# Patient Record
Sex: Female | Born: 1980 | Race: Black or African American | Hispanic: No | Marital: Single | Smoking: Never smoker
Health system: Southern US, Community
[De-identification: ages and names within clinical notes are randomized; demographics above are authoritative.]

---

## 2019-01-22 ENCOUNTER — Ambulatory Visit (HOSPITAL_COMMUNITY)
Admission: EM | Admit: 2019-01-22 | Discharge: 2019-01-22 | Disposition: A | Discharge status: Home / Self Care | Payer: Commercial Managed Care - PPO | Attending: Emergency Medicine | Admitting: Emergency Medicine

## 2019-01-22 ENCOUNTER — Other Ambulatory Visit: Payer: Self-pay

## 2019-01-22 ENCOUNTER — Encounter (HOSPITAL_COMMUNITY): Payer: Self-pay

## 2019-01-22 DIAGNOSIS — M67432 Ganglion, left wrist: Secondary | ICD-10-CM

## 2019-01-22 MED ORDER — IBUPROFEN 600 MG PO TABS: 600.0000 mg | ORAL_TABLET | Freq: Four times a day (QID) | ORAL | 0 refills | Status: DC | PRN

## 2019-01-22 MED ORDER — HYDROCODONE-ACETAMINOPHEN 5-325 MG PO TABS: 1.0000 | ORAL_TABLET | Freq: Four times a day (QID) | ORAL | 0 refills | Status: DC | PRN

## 2019-01-22 NOTE — ED Triage Notes (Signed)
Pt states she has a cyst on her left wrist. This started today.

## 2019-01-22 NOTE — ED Provider Notes (Signed)
HPI  SUBJECTIVE:  Melanie [name] is a right-handed 38 y.o. female who presents with an acute recurrence of a left ganglion cyst.  States that she was pushing an earring back onto an earring and the cyst "popped up".  She reports constant throbbing left wrist pain.  States the numbness has been present since this occurred earlier today.  Symptoms are worse with flexing her wrist, no alleviating factors.  She has not tried anything for this, but came here.  She has a past medical history of bilateral ganglion cysts.  Left one measured 5 cm and was removed in February.  States that she has had numbness on the dorsum of her hand since the surgery, but it got a little worse today.  She is not a smoker or diabetic.  LMP: Now.  Denies the possibility of being pregnant.  PMD: Dr. Maricela Bo at [facility].  She is looking for a primary care physician here in [place].   History reviewed. No pertinent past medical history.  History reviewed. No pertinent surgical history.  No family history on file.  Social History   Tobacco Use  . Smoking status: Never Smoker  . Smokeless tobacco: Never Used  Substance Use Topics  . Alcohol use: Yes  . Drug use: Not on file    No current facility-administered medications for this encounter.   Current Outpatient Medications:  .  HYDROcodone-acetaminophen (NORCO/VICODIN) 5-325 MG tablet, Take 1-2 tablets by mouth every 6 (six) hours as needed for moderate pain or severe pain., Disp: 12 tablet, Rfl: 0 .  ibuprofen (ADVIL) 600 MG tablet, Take 1 tablet (600 mg total) by mouth every 6 (six) hours as needed., Disp: 30 tablet, Rfl: 0  No Known Allergies   ROS  As noted in HPI.   Physical Exam  BP 110/75 (BP Location: Right Arm)   Pulse 80   Temp 98.3 F (36.8 C) (Oral)   Resp 16   Wt 59 kg   LMP 01/22/2019   SpO2 100%   Constitutional: Well developed, well nourished, no acute distress Eyes:  EOMI, conjunctiva normal bilaterally HENT: Normocephalic,  atraumatic,mucus membranes moist Respiratory: Normal inspiratory effort Cardiovascular: Normal rate GI: nondistended skin: No rash, skin intact Musculoskeletal: Tender small ganglion cyst dorsum of left wrist.  No erythema, increased temperature.  L  distal radius NT, distal ulnar styloid NT , snuffbox NT, carpals NT, metacarpals NT, digits NT.  Pain with wrist flexion.  No pain with wrist extension.  No pain with supination,  no pain with pronation, no pain with radial / ulnar deviation. Motor intact ability to flex / extend digits of affected hand, Sensation LT to dorsum of hand decreased, but intact.  Finger sensation normal. Neurologic: Alert & oriented x 3, no focal neuro deficits Psychiatric: Speech and behavior appropriate   ED Course   Medications - No data to display  Orders Placed This Encounter  Procedures  . Splint wrist    Standing Status:   Standing    Number of Occurrences:   1    Order Specific Question:   Laterality    Answer:   Left    No results found for this or any previous visit (from the past 24 hour(s)). No results found.  ED Clinical Impression  1. Ganglion cyst of dorsum of left wrist      ED Assessment/Plan  [place] Narcotic database reviewed for this patient, and feel that the risk/benefit ratio today is favorable for proceeding with a prescription for controlled substance.  Last narcotic prescription was for 12 tramadol on 7/22.  Patient with an acute recurrence of a ganglion cyst.  Placing in wrist splint.  Home with ibuprofen and a Tylenol containing product 3 or 4 times a day.  Ibuprofen 1 g of Tylenol for mild to moderate pain, ibuprofen Norco for severe pain.  Follow-up with Dr. Amedeo Plenty or  [facility] ASAP.  Also providing primary care list for routine care.  Discussed MDM, treatment plan, and plan for follow-up with patient.  patient agrees with plan.   Meds ordered this encounter  Medications  . ibuprofen (ADVIL) 600 MG tablet    Sig: Take 1 tablet  (600 mg total) by mouth every 6 (six) hours as needed.    Dispense:  30 tablet    Refill:  0  . HYDROcodone-acetaminophen (NORCO/VICODIN) 5-325 MG tablet    Sig: Take 1-2 tablets by mouth every 6 (six) hours as needed for moderate pain or severe pain.    Dispense:  12 tablet    Refill:  0    *This clinic note was created using [employer]. Therefore, there may be occasional mistakes despite careful proofreading.   ?    Melynda Ripple, MD 01/22/19 1526

## 2019-01-22 NOTE — Discharge Instructions (Addendum)
Take 600 mg ibuprofen combined with a Tylenol containing product 3 or 4 times a day.  Either ibuprofen/1 g of Tylenol for mild to moderate pain or ibuprofen/1-2 Norco for severe pain.  Do not take Tylenol and Norco as they both have Tylenol in them and too much Tylenol can hurt your liver.  Do not exceed 4 g of Tylenol from all sources in 1 day.  Wear the wrist splint if it is helpful.  Follow-up with Dr. Amedeo Plenty or Fredna Dow as soon as you can.  Here is a list of primary care providers for taking new patients. Below is a list of primary care practices who are taking new patients for you to follow-up with.  [facility] at [facility] [address] [place], [place] [address] [phone]  [place], [place] [address] [phone]  [facility] [phone] [place] [address]  [facility]: [place]  [phone]  [place] [facility]: [place]   [phone]  [facility]: [address] [place]  [phone]  [facility] : [address] [place] [phone]  [facility]: [address] [place] [phone]  [facility]: [place] [phone]  Dr. Blanchie Serve [place]  [phone]  Dr. Benito Mccreedy, [facility]. [place], [place] [address]  [phone]  Go to www.goodrx.com to look up your medications. This will give you a list of where you can find your prescriptions at the most affordable prices. Or ask the  pharmacist what the cash price is, or if they have any other discount programs available to help make your medication more affordable. This can be less expensive than what you would pay with insurance.

## 2019-10-04 ENCOUNTER — Encounter (HOSPITAL_COMMUNITY): Payer: Self-pay | Admitting: Emergency Medicine

## 2019-10-04 ENCOUNTER — Emergency Department (HOSPITAL_COMMUNITY)
Admission: EM | Admit: 2019-10-04 | Discharge: 2019-10-05 | Disposition: A | Discharge status: Home / Self Care | Payer: Commercial Managed Care - PPO | Attending: Emergency Medicine | Admitting: Emergency Medicine

## 2019-10-04 ENCOUNTER — Other Ambulatory Visit: Payer: Self-pay

## 2019-10-04 DIAGNOSIS — R1084 Generalized abdominal pain: Secondary | ICD-10-CM | POA: Insufficient documentation

## 2019-10-04 DIAGNOSIS — R110 Nausea: Secondary | ICD-10-CM | POA: Diagnosis not present

## 2019-10-04 DIAGNOSIS — R1013 Epigastric pain: Secondary | ICD-10-CM | POA: Diagnosis present

## 2019-10-04 DIAGNOSIS — R911 Solitary pulmonary nodule: Secondary | ICD-10-CM | POA: Insufficient documentation

## 2019-10-04 DIAGNOSIS — Z87891 Personal history of nicotine dependence: Secondary | ICD-10-CM | POA: Diagnosis not present

## 2019-10-04 MED ORDER — SODIUM CHLORIDE 0.9% FLUSH: 3.0000 mL | Freq: Once | INTRAVENOUS | Status: DC

## 2019-10-04 NOTE — ED Triage Notes (Signed)
Patient reports mid/epigastric pain " heartburn" after eating supper this evening , no emesis or diarrhea . No fever or chills .

## 2019-10-05 ENCOUNTER — Emergency Department (HOSPITAL_COMMUNITY): Payer: Commercial Managed Care - PPO

## 2019-10-05 ENCOUNTER — Emergency Department (HOSPITAL_COMMUNITY)
Admission: EM | Admit: 2019-10-05 | Discharge: 2019-10-05 | Disposition: A | Discharge status: Home / Self Care | Payer: Commercial Managed Care - PPO | Source: Home / Self Care | Attending: Emergency Medicine | Admitting: Emergency Medicine

## 2019-10-05 ENCOUNTER — Other Ambulatory Visit: Payer: Self-pay

## 2019-10-05 ENCOUNTER — Encounter (HOSPITAL_COMMUNITY): Payer: Self-pay

## 2019-10-05 DIAGNOSIS — R1084 Generalized abdominal pain: Secondary | ICD-10-CM | POA: Insufficient documentation

## 2019-10-05 DIAGNOSIS — R0602 Shortness of breath: Secondary | ICD-10-CM | POA: Insufficient documentation

## 2019-10-05 DIAGNOSIS — R911 Solitary pulmonary nodule: Secondary | ICD-10-CM

## 2019-10-05 DIAGNOSIS — R110 Nausea: Secondary | ICD-10-CM | POA: Insufficient documentation

## 2019-10-05 LAB — COMPREHENSIVE METABOLIC PANEL
ALT: 12 U/L (ref 0–44)
AST: 21 U/L (ref 15–41)
Albumin: 4.3 g/dL (ref 3.5–5.0)
Alkaline Phosphatase: 51 U/L (ref 38–126)
Anion gap: 13 (ref 5–15)
BUN: 11 mg/dL (ref 6–20)
CO2: 20 mmol/L — ABNORMAL LOW (ref 22–32)
Calcium: 9.8 mg/dL (ref 8.9–10.3)
Chloride: 106 mmol/L (ref 98–111)
Creatinine, Ser: 1.13 mg/dL — ABNORMAL HIGH (ref 0.44–1.00)
GFR calc Af Amer: >60 mL/min (ref >60)
GFR calc non Af Amer: >60 mL/min (ref >60)
Glucose, Bld: 86 mg/dL (ref 70–99)
Potassium: 3.6 mmol/L (ref 3.5–5.1)
Sodium: 139 mmol/L (ref 135–145)
Total Bilirubin: 0.6 mg/dL (ref 0.3–1.2)
Total Protein: 7.2 g/dL (ref 6.5–8.1)

## 2019-10-05 LAB — CBC
HCT: 40.4 % (ref 36.0–46.0)
Hemoglobin: 13.1 g/dL (ref 12.0–15.0)
MCH: 31.3 pg (ref 26.0–34.0)
MCHC: 32.4 g/dL (ref 30.0–36.0)
MCV: 96.7 fL (ref 80.0–100.0)
Platelets: 368 10*3/uL (ref 150–400)
RBC: 4.18 MIL/uL (ref 3.87–5.11)
RDW: 13.7 % (ref 11.5–15.5)
WBC: 10.8 10*3/uL — ABNORMAL HIGH (ref 4.0–10.5)
nRBC: 0 % (ref 0.0–0.2)

## 2019-10-05 LAB — URINALYSIS, ROUTINE W REFLEX MICROSCOPIC
Bilirubin Urine: NEGATIVE
Glucose, UA: NEGATIVE mg/dL
Hgb urine dipstick: NEGATIVE
Ketones, ur: NEGATIVE mg/dL
Leukocytes,Ua: NEGATIVE
Nitrite: NEGATIVE
Protein, ur: NEGATIVE mg/dL
Specific Gravity, Urine: 1.025 (ref 1.005–1.030)
pH: 6 (ref 5.0–8.0)

## 2019-10-05 LAB — POC URINE PREG, ED: Preg Test, Ur: NEGATIVE

## 2019-10-05 LAB — LIPASE, BLOOD: Lipase: 28 U/L (ref 11–51)

## 2019-10-05 LAB — TROPONIN I (HIGH SENSITIVITY): Troponin I (High Sensitivity): 2 ng/L (ref <18)

## 2019-10-05 MED ORDER — LIDOCAINE VISCOUS HCL 2 % MT SOLN
15.0000 mL | Freq: Once | OROMUCOSAL | Status: AC
  Administered 2019-10-05: 15 mL via ORAL
  Filled 2019-10-05: qty 15

## 2019-10-05 MED ORDER — SODIUM CHLORIDE 0.9 % IV BOLUS
1000.0000 mL | Freq: Once | INTRAVENOUS | Status: AC
  Administered 2019-10-05: 1000 mL via INTRAVENOUS

## 2019-10-05 MED ORDER — PANTOPRAZOLE SODIUM 20 MG PO TBEC: 20.0000 mg | DELAYED_RELEASE_TABLET | Freq: Every day | ORAL | 0 refills | Status: AC

## 2019-10-05 MED ORDER — SODIUM CHLORIDE (PF) 0.9 % IJ SOLN
INTRAMUSCULAR | Status: AC
  Filled 2019-10-05: qty 50

## 2019-10-05 MED ORDER — FAMOTIDINE IN NACL 20-0.9 MG/50ML-% IV SOLN
20.0000 mg | Freq: Once | INTRAVENOUS | Status: AC
  Administered 2019-10-05: 20 mg via INTRAVENOUS
  Filled 2019-10-05: qty 50

## 2019-10-05 MED ORDER — IOHEXOL 300 MG/ML  SOLN
100.0000 mL | Freq: Once | INTRAMUSCULAR | Status: AC | PRN
  Administered 2019-10-05: 100 mL via INTRAVENOUS

## 2019-10-05 MED ORDER — ALUM & MAG HYDROXIDE-SIMETH 200-200-20 MG/5ML PO SUSP
30.0000 mL | Freq: Once | ORAL | Status: AC
  Administered 2019-10-05: 30 mL via ORAL
  Filled 2019-10-05: qty 30

## 2019-10-05 NOTE — ED Notes (Addendum)
Pt reports improvement after lido& maalox

## 2019-10-05 NOTE — ED Notes (Signed)
Pt reports burning has returned, same sensation as earlier.

## 2019-10-05 NOTE — ED Notes (Signed)
Urine and culture sent to lab after POC urine preg test complete.

## 2019-10-05 NOTE — ED Triage Notes (Signed)
Patient reports mid/epigastric pain " heartburn" after eating supper this evening , no emesis or diarrhea . No fever or chills .   [facility] but left due to wait.

## 2019-10-05 NOTE — ED Provider Notes (Signed)
[facility] DEPT Provider Note   CSN: [id number] Arrival date & time: 10/05/19  1055     History Chief Complaint  Patient presents with  . Abdominal Pain    Oral [place] is a 39 y.o. female who presents to the ED today with complaint of sudden onset, constant, waxing and waning, sharp, epigastric abdominal pain radiating into L chest that began last night. Patient reports she had eaten a quesadilla with salsa and approximately 2 to 3 minutes after eating and she began having severe pain. He also complains of nausea. She went to [facility], ED to be seen and had labs drawn however left prior to being seen due to wait time. She states she went home and tried multiple over-the-counter medications including Mylanta, Tums, Pepcid, ginger tea and other home remedies without relief. Patient states the pain is getting worse and making her feel slightly short of breath. She states she is here because she cannot get the pain to go away. Of note past surgical history includes cholecystectomy in 2003. Patient denies any previous issues with acid reflux. She is a former smoker, quit in 2018 or 2019 reports a 5-pack-year history. Denies any family history of cardiac disease. Denies fevers, chills, vomiting, diarrhea, constipation, urinary sx, or any other associated symptoms.   The history is provided by the patient and medical records.    HPI: A 39 year old patient presents for evaluation of chest pain. Initial onset of pain was more than 6 hours ago. The patient's chest pain is well-localized, is sharp and is not worse with exertion. The patient complains of nausea. The patient's chest pain is middle- or left-sided, is not described as heaviness/pressure/tightness and does not radiate to the arms/jaw/neck. The patient denies diaphoresis. The patient has no history of stroke, has no history of peripheral artery disease, has not smoked in the past 90 days, denies any history  of treated diabetes, has no relevant family history of coronary artery disease (first degree relative at less than age 4), is not hypertensive, has no history of hypercholesterolemia and does not have an elevated BMI (>=30).   History reviewed. No pertinent past medical history.  There are no problems to display for this patient.   History reviewed. No pertinent surgical history.   OB History   No obstetric history on file.     History reviewed. No pertinent family history.  Social History   Tobacco Use  . Smoking status: Never Smoker  . Smokeless tobacco: Never Used  Substance Use Topics  . Alcohol use: Yes  . Drug use: Never    Home Medications Prior to Admission medications   Medication Sig Start Date End Date Taking? Authorizing Provider  HYDROcodone-acetaminophen (NORCO/VICODIN) 5-325 MG tablet Take 1-2 tablets by mouth every 6 (six) hours as needed for moderate pain or severe pain. 01/22/19   Melynda Ripple, MD  ibuprofen (ADVIL) 600 MG tablet Take 1 tablet (600 mg total) by mouth every 6 (six) hours as needed. 01/22/19   Melynda Ripple, MD  pantoprazole (PROTONIX) 20 MG tablet Take 1 tablet (20 mg total) by mouth daily. 10/05/19 11/04/19  Eustaquio Maize, PA-C    Allergies    Patient has no known allergies.  Review of Systems   Review of Systems  Constitutional: Negative for chills and fever.  Respiratory: Positive for shortness of breath.   Cardiovascular: Positive for chest pain. Negative for palpitations and leg swelling.  Gastrointestinal: Positive for abdominal pain and nausea. Negative for constipation, diarrhea  and vomiting.  All other systems reviewed and are negative.   Physical Exam Updated Vital Signs BP (!) 125/96 (BP Location: Right Arm)   Pulse 67   Temp 98.8 F (37.1 C) (Oral)   Resp 18   LMP 09/25/2019   SpO2 100%   Physical Exam Vitals and nursing note reviewed.  Constitutional:      Comments: Uncomfortable appearing female  HENT:      Head: Normocephalic and atraumatic.  Eyes:     Conjunctiva/sclera: Conjunctivae normal.  Cardiovascular:     Rate and Rhythm: Normal rate and regular rhythm.     Heart sounds: Normal heart sounds.  Pulmonary:     Effort: Pulmonary effort is normal.     Breath sounds: Normal breath sounds. No wheezing, rhonchi or rales.  Chest:     Chest wall: Tenderness present.  Abdominal:     General: Abdomen is flat.     Palpations: Abdomen is soft.     Tenderness: There is abdominal tenderness in the epigastric area. There is no right CVA tenderness, left CVA tenderness, guarding or rebound.  Musculoskeletal:     Cervical back: Neck supple.  Skin:    General: Skin is warm and dry.  Neurological:     Mental Status: She is alert.     ED Results / Procedures / Treatments   Labs (all labs ordered are listed, but only abnormal results are displayed) Labs Reviewed  URINALYSIS, ROUTINE W REFLEX MICROSCOPIC - Abnormal; Notable for the following components:      Result Value   APPearance HAZY (*)    All other components within normal limits  POC URINE PREG, ED  TROPONIN I (HIGH SENSITIVITY)    EKG None  Radiology CT Abdomen Pelvis W Contrast  Result Date: 10/05/2019 CLINICAL DATA:  Acute onset epigastric pain after eating this evening. No vomiting or diarrhea. EXAM: CT ABDOMEN AND PELVIS WITH CONTRAST TECHNIQUE: Multidetector CT imaging of the abdomen and pelvis was performed using the standard protocol following bolus administration of intravenous contrast. CONTRAST:  132mL OMNIPAQUE IOHEXOL 300 MG/ML  SOLN COMPARISON:  None. FINDINGS: Lower chest: Clear lung bases. No significant pleural or pericardial effusion. Hepatobiliary: The liver is normal in density without suspicious focal abnormality. Probable tiny cyst peripherally in the right lobe best seen on coronal image 60/5. There are small subcapsular calcifications posteriorly in the right lobe. No significant biliary dilatation post  cholecystectomy. Pancreas: Unremarkable. No pancreatic ductal dilatation or surrounding inflammatory changes. Spleen: Normal in size without focal abnormality. Adrenals/Urinary Tract: Both adrenal glands appear normal. Probable tiny cyst in the interpolar region of the right kidney, best seen on image 15/7. The kidneys otherwise appear normal. No evidence of urinary tract calculus or hydronephrosis. The bladder appears normal. Stomach/Bowel: No evidence of bowel wall thickening, distention or surrounding inflammatory change. The appendix appears normal. Vascular/Lymphatic: There are no enlarged abdominal or pelvic lymph nodes. Mild aortic and branch vessel atherosclerosis. No acute vascular findings. The portal, superior mesenteric and splenic veins are patent. Reproductive: Mild nonspecific myometrial heterogeneity with low density near the cervix, probably nabothian cysts. Probable small collapsing right ovarian follicle (image 41/2. No suspicious adnexal findings. Other: Trace pelvic ascites, within physiologic limits. No generalized ascites, focal extraluminal fluid collection or free air. Intact anterior abdominal wall. Musculoskeletal: No acute or significant osseous findings. Degenerative disc disease at L5-S1. IMPRESSION: 1. No acute findings or explanation for the patient's symptoms. 2. Aortic Atherosclerosis (ICD10-I70.0). 3. Probable incidental findings in the pelvis. Degenerative  disc disease at L5-S1. Electronically Signed   By: Richardean Sale M.D.   On: 10/05/2019 17:14   DG Chest Port 1 View  Result Date: 10/05/2019 CLINICAL DATA:  39 y.o female reports mid/epigastric pain " heartburn" after eating supper yesterday evening , no emesis or diarrhea . No fever or chills. EXAM: PORTABLE CHEST 1 VIEW COMPARISON:  None. FINDINGS: The heart size and mediastinal contours are within normal limits. There is a nodule in the left upper lung measuring 1.4 cm. The lungs are otherwise clear. No pneumothorax or  large pleural effusion. No acute finding in the visualized skeleton. IMPRESSION: 1. No acute cardiopulmonary finding. 2. 1.4 cm nodule in the left upper lung, recommend follow-up nonemergent CT. Electronically Signed   By: Audie Pinto M.D.   On: 10/05/2019 12:51    Procedures Procedures (including critical care time)  Medications Ordered in ED Medications  sodium chloride (PF) 0.9 % injection (  Canceled Entry 10/05/19 1648)  sodium chloride 0.9 % bolus 1,000 mL (0 mLs Intravenous Stopped 10/05/19 1456)  alum & mag hydroxide-simeth (MAALOX/MYLANTA) 200-200-20 MG/5ML suspension 30 mL (30 mLs Oral Given 10/05/19 1253)    And  lidocaine (XYLOCAINE) 2 % viscous mouth solution 15 mL (15 mLs Oral Given 10/05/19 1254)  famotidine (PEPCID) IVPB 20 mg premix (0 mg Intravenous Stopped 10/05/19 1425)  iohexol (OMNIPAQUE) 300 MG/ML solution 100 mL (100 mLs Intravenous Contrast Given 10/05/19 1649)    ED Course  I have reviewed the triage vital signs and the nursing notes.  Pertinent labs & imaging results that were available during my care of the patient were reviewed by me and considered in my medical decision making (see chart for details).    MDM Rules/Calculators/A&P HEAR Score: 1                        39 year old female who presents to the ED today with persistent epigastric/chest pain that began yesterday after eating a quesadilla and salsa. No previous issues with acid reflux. [facility] yesterday and had labs drawn including CBC, CMP, lipase. However patient left before being seen. Returns today with persistent pain. Tried multiple over-the-counter medications without relief. On arrival to the ED patient is afebrile, nontachycardic nontachypneic. She does appear uncomfortable on exam. She is clutching her stomach. On exam she has epigastric abdominal tenderness as well as left-sided chest wall tenderness palpation. Her lungs are clear to auscultation bilaterally. No lower abdominal  pain, I doubt acute abdomen at this time. Patient is a former smoker, no other risk factors for ACS. Denies family history of CAD.   Labs drawn last night:   Ref. Range 10/04/2019 23:59  WBC Latest Ref Range: 4.0 - 10.5 K/uL 10.8 (H)  RBC Latest Ref Range: 3.87 - 5.11 MIL/uL 4.18  Hemoglobin Latest Ref Range: 12.0 - 15.0 g/dL 13.1  HCT Latest Ref Range: 36 - 46 % 40.4  MCV Latest Ref Range: 80.0 - 100.0 fL 96.7  MCH Latest Ref Range: 26.0 - 34.0 pg 31.3  MCHC Latest Ref Range: 30.0 - 36.0 g/dL 32.4  RDW Latest Ref Range: 11.5 - 15.5 % 13.7  Platelets Latest Ref Range: 150 - 400 K/uL 368  nRBC Latest Ref Range: 0.0 - 0.2 % 0.0     Ref. Range 10/04/2019 23:59  Sodium Latest Ref Range: 135 - 145 mmol/L 139  Potassium Latest Ref Range: 3.5 - 5.1 mmol/L 3.6  Chloride Latest Ref Range: 98 -  111 mmol/L 106  CO2 Latest Ref Range: 22 - 32 mmol/L 20 (L)  Glucose Latest Ref Range: 70 - 99 mg/dL 86  BUN Latest Ref Range: 6 - 20 mg/dL 11  Creatinine Latest Ref Range: 0.44 - 1.00 mg/dL 1.13 (H)  Calcium Latest Ref Range: 8.9 - 10.3 mg/dL 9.8  Anion gap Latest Ref Range: 5 - 15  13  Alkaline Phosphatase Latest Ref Range: 38 - 126 U/L 51  Albumin Latest Ref Range: 3.5 - 5.0 g/dL 4.3  Lipase Latest Ref Range: 11 - 51 U/L 28  AST Latest Ref Range: 15 - 41 U/L 21  ALT Latest Ref Range: 0 - 44 U/L 12  Total Protein Latest Ref Range: 6.5 - 8.1 g/dL 7.2  Total Bilirubin Latest Ref Range: 0.3 - 1.2 mg/dL 0.6  GFR, Est Non African American Latest Ref Range: >60 mL/min >60  GFR, Est African American Latest Ref Range: >60 mL/min >60   Do not feel patient needs repeat CBC, CMP, lipase. Will provide fluids given mildly elevated creatinine at 1.13. Per care everywhere patient's baseline around 0.80. Add on EKG, chest x-ray, troponin. Will provide GI cocktail and reassess. Given patient's history of cholecystectomy I do not feel she needs a right upper quadrant ultrasound at this time.   CXR with findings of  lung nodule on left side with recommendations for outpatient CT scan. Pt does have a PCP that she can follow up with regarding this.  Troponin of 2. Do not feel pt needs repeat at this time. Heart score of: 1; no recommendation for additional troponin testing.   On reevaluation pt reports the GI cocktail helped for a little bit however states burning has returned. Will try pepcid IV at this time.   After pepcid pt continues to state her pain is severe. She is now stating her pain is in her lower abdomen; given this will obtain CT scan.   CT scan without any acute findings today. On reevaluation pt states she is feeling improved and would like to go home. Will discharge home at this time with protonix. Pt instructed to follow up with PCP regarding ED visit as well as findings on CXR. Strict return precautions have been discussed. Pt is in agreement with plan and stable for discharge home.   This note was prepared using Dragon voice recognition software and may include unintentional dictation errors due to the inherent limitations of voice recognition software.   Final Clinical Impression(s) / ED Diagnoses Final diagnoses:  Generalized abdominal pain  Nausea  Lung nodule    Rx / DC Orders ED Discharge Orders         Ordered    pantoprazole (PROTONIX) 20 MG tablet  Daily     Discontinue  Reprint     10/05/19 1722           Discharge Instructions     Your labwork and imaging were reassuring today. Your CT scan of the abdomen did not show any signs of infection in your abdomen. Your symptoms may be related to acid reflux or an ulcer in your stomach. Please pick up the medication and take as prescribed. Follow up with your PCP for recheck next week.   Your xray of the chest did show a nodule measuring 1.4 cm on the left upper lung - it is recommended that you have a CT scan outpatient of your chest for further evaluation. Please mention this to your PCP as they can order it outpatient.  Return to the ED IMMEDIATELY for any worsening symptoms including worsening pain, shortness of breath, excessive vomiting, blood in stool or vomit, or any other new/concerning symptoms.        Eustaquio Maize, PA-C 10/05/19 1730    Carmin Muskrat, MD 10/06/19 2230

## 2019-10-05 NOTE — ED Notes (Signed)
Pt stated they were leaving  

## 2019-10-05 NOTE — ED Notes (Signed)
Pt provided update

## 2019-10-05 NOTE — ED Notes (Signed)
Pt reports improvement with pepcid

## 2019-10-05 NOTE — ED Notes (Signed)
Pt ambulatory to restroom

## 2019-10-05 NOTE — Discharge Instructions (Signed)
Your labwork and imaging were reassuring today. Your CT scan of the abdomen did not show any signs of infection in your abdomen. Your symptoms may be related to acid reflux or an ulcer in your stomach. Please pick up the medication and take as prescribed. Follow up with your PCP for recheck next week.   Your xray of the chest did show a nodule measuring 1.4 cm on the left upper lung - it is recommended that you have a CT scan outpatient of your chest for further evaluation. Please mention this to your PCP as they can order it outpatient.   Return to the ED IMMEDIATELY for any worsening symptoms including worsening pain, shortness of breath, excessive vomiting, blood in stool or vomit, or any other new/concerning symptoms.

## 2020-05-24 ENCOUNTER — Emergency Department (HOSPITAL_COMMUNITY): Payer: Commercial Managed Care - PPO

## 2020-05-24 ENCOUNTER — Emergency Department (HOSPITAL_COMMUNITY)
Admission: EM | Admit: 2020-05-24 | Discharge: 2020-05-24 | Discharge status: Against Medical Advice | Payer: Commercial Managed Care - PPO | Attending: Emergency Medicine | Admitting: Emergency Medicine

## 2020-05-24 ENCOUNTER — Encounter (HOSPITAL_COMMUNITY): Payer: Self-pay | Admitting: Emergency Medicine

## 2020-05-24 DIAGNOSIS — M25532 Pain in left wrist: Secondary | ICD-10-CM | POA: Diagnosis present

## 2020-05-24 DIAGNOSIS — R200 Anesthesia of skin: Secondary | ICD-10-CM | POA: Insufficient documentation

## 2020-05-24 DIAGNOSIS — M67432 Ganglion, left wrist: Secondary | ICD-10-CM | POA: Insufficient documentation

## 2020-05-24 DIAGNOSIS — M6740 Ganglion, unspecified site: Secondary | ICD-10-CM

## 2020-05-24 NOTE — ED Triage Notes (Signed)
Per pt, states she thinks her left wrist ganglian cyst has ruptured-states she noticed it today-skin discolored at site

## 2020-05-24 NOTE — Discharge Instructions (Signed)
You have elected to leave the emergency department before completing your work-up could result in missing a diagnosis which could result in disability or even death.  However, generally ruptured ganglion cyst do not pose any life-threatening problems.  Please take Tylenol/ibuprofen for pain, you may follow-up with your orthopedic doctor.  Please feel free to return to the ER at any time to complete your work-up.

## 2020-05-24 NOTE — ED Provider Notes (Signed)
[facility] DEPT Provider Note   CSN: [id number] Arrival date & time: 05/24/20  1339     History Chief Complaint  Patient presents with  . Wrist Pain    Melanie [name] is a 40 y.o. female.  HPI 40 year old female with no medical history in our system presents to the ER with complaints of left ganglion cyst which she was afraid is ruptured.  She states she gets ganglion cyst all the time, however is never had one ruptured.  She has had a history of surgical excision of a ganglion cyst.  She reports noticing it getting smaller and popping yesterday, has felt some numbness and tingling in all 5 digits which prompted her to come to the ER.  She was afraid she might have an infection.  She is followed with orthopedics through [facility].  Denies any fevers or chills.  Has not taken anything for her pain.    History reviewed. No pertinent past medical history.  There are no problems to display for this patient.   History reviewed. No pertinent surgical history.   OB History   No obstetric history on file.     No family history on file.  Social History   Tobacco Use  . Smoking status: Never Smoker  . Smokeless tobacco: Never Used  Substance Use Topics  . Alcohol use: Yes  . Drug use: Never    Home Medications Prior to Admission medications   Medication Sig Start Date End Date Taking? Authorizing Provider  HYDROcodone-acetaminophen (NORCO/VICODIN) 5-325 MG tablet Take 1-2 tablets by mouth every 6 (six) hours as needed for moderate pain or severe pain. 01/22/19   Melynda Ripple, MD  ibuprofen (ADVIL) 600 MG tablet Take 1 tablet (600 mg total) by mouth every 6 (six) hours as needed. 01/22/19   Melynda Ripple, MD  pantoprazole (PROTONIX) 20 MG tablet Take 1 tablet (20 mg total) by mouth daily. 10/05/19 11/04/19  Eustaquio Maize, PA-C    Allergies    Patient has no known allergies.  Review of Systems   Review of Systems  Musculoskeletal:  Positive for arthralgias.  Neurological: Positive for numbness.    Physical Exam Updated Vital Signs BP 115/89 (BP Location: Right Arm)   Pulse 90   Temp 98.9 F (37.2 C) (Oral)   Resp 16   SpO2 100%   Physical Exam Vitals and nursing note reviewed.  Constitutional:      General: She is not in acute distress.    Appearance: She is well-developed and well-nourished.  HENT:     Head: Normocephalic and atraumatic.  Eyes:     Conjunctiva/sclera: Conjunctivae normal.  Cardiovascular:     Rate and Rhythm: Normal rate and regular rhythm.     Pulses: Normal pulses.     Heart sounds: No murmur heard.   Pulmonary:     Effort: Pulmonary effort is normal. No respiratory distress.     Breath sounds: Normal breath sounds.  Abdominal:     Palpations: Abdomen is soft.     Tenderness: There is no abdominal tenderness.  Musculoskeletal:        General: Tenderness present. No signs of injury or edema. Normal range of motion.     Cervical back: Neck supple.     Right lower leg: No edema.     Left lower leg: No edema.     Comments: Left lateral wrist with evidence of remnants of a ganglion cyst.  Moving all 5 digits without difficulty.  Able to  flex and extend wrist without difficulty.  No overlying erythema, warmth.  Reports decreased sensation with pain at the fingertips.<2 cap refill.  Some tenderness to palpation over the ganglion cyst  Skin:    General: Skin is warm and dry.  Neurological:     General: No focal deficit present.     Mental Status: She is alert and oriented to person, place, and time.  Psychiatric:        Mood and Affect: Mood and affect and mood normal.        Behavior: Behavior normal.     ED Results / Procedures / Treatments   Labs (all labs ordered are listed, but only abnormal results are displayed) Labs Reviewed - No data to display  EKG None  Radiology No results found.  Procedures Procedures   Medications Ordered in ED Medications - No data to  display  ED Course  I have reviewed the triage vital signs and the nursing notes.  Pertinent labs & imaging results that were available during my care of the patient were reviewed by me and considered in my medical decision making (see chart for details).    MDM Rules/Calculators/A&P                          40 year old female who presents to the ER with concern for numbness in the setting of recent ganglion cyst rupture.  On arrival, vitals reassuring.  She does have evidence of the remnants of a ganglion cyst on her left lateral wrist.  She has full range of motion of the wrist, moving all 5 digits without difficulty.  No evidence of septic joint.  She does report some numbness to the tips of all 5 fingers however the nerve distribution is not consistent with this.  I did offer the patient a x-ray, however while waiting on the x-ray the patient expressed the desire to leave.  I did explain to her the risks of leaving without completing her work-up which she voiced understanding and still voiced desire to leave.  To identify that the patient was capable of making her own decisions.  She states that she will be calling her orthopedic doctor today to schedule an appointment for follow-up.  I welcomed her to return to the ER at any time to complete her work-up.  Encouraged Tylenol/ibuprofen for pain.  She voiced understanding and is agreeable. Final Clinical Impression(s) / ED Diagnoses Final diagnoses:  Ruptured ganglion cyst    Rx / DC Orders ED Discharge Orders    None       Garald Balding, PA-C 05/24/20 1641    Tegeler, Gwenyth Allegra, MD 05/25/20 [phone]

## 2020-12-25 ENCOUNTER — Encounter (HOSPITAL_COMMUNITY): Payer: Self-pay | Admitting: *Deleted

## 2020-12-25 ENCOUNTER — Emergency Department (HOSPITAL_COMMUNITY): Payer: Self-pay

## 2020-12-25 ENCOUNTER — Emergency Department (HOSPITAL_COMMUNITY)
Admission: EM | Admit: 2020-12-25 | Discharge: 2020-12-25 | Disposition: A | Discharge status: Home / Self Care | Payer: Self-pay | Attending: Emergency Medicine | Admitting: Emergency Medicine

## 2020-12-25 DIAGNOSIS — S63237A Subluxation of proximal interphalangeal joint of left little finger, initial encounter: Secondary | ICD-10-CM | POA: Insufficient documentation

## 2020-12-25 DIAGNOSIS — X58XXXA Exposure to other specified factors, initial encounter: Secondary | ICD-10-CM | POA: Insufficient documentation

## 2020-12-25 DIAGNOSIS — S6992XA Unspecified injury of left wrist, hand and finger(s), initial encounter: Secondary | ICD-10-CM

## 2020-12-25 MED ORDER — HYDROCODONE-ACETAMINOPHEN 5-325 MG PO TABS: 1.0000 | ORAL_TABLET | ORAL | 0 refills | Status: AC | PRN

## 2020-12-25 MED ORDER — IBUPROFEN 600 MG PO TABS: 600.0000 mg | ORAL_TABLET | Freq: Four times a day (QID) | ORAL | 0 refills | Status: AC | PRN

## 2020-12-25 MED ORDER — LIDOCAINE HCL (PF) 1 % IJ SOLN
5.0000 mL | Freq: Once | INTRAMUSCULAR | Status: DC
  Filled 2020-12-25: qty 30

## 2020-12-25 MED ORDER — HYDROCODONE-ACETAMINOPHEN 5-325 MG PO TABS
1.0000 | ORAL_TABLET | Freq: Once | ORAL | Status: AC
  Administered 2020-12-25: 1 via ORAL
  Filled 2020-12-25: qty 1
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
## 2020-12-25 NOTE — Discharge Instructions (Addendum)
Follow up with Dr. Lenon Curt (Hand specialist) later in the week to re-evaluate the injury.   Ice to reduce any swelling. Take medications as prescribed. You can wear the splint for comfort when active and leave off when at rest.

## 2022-01-14 IMAGING — CT CT ABD-PELV W/ CM
2 of 5 series · 15 of 46 positions shown, 17 images · IV contrast (omnipaque)
Comparison: None.

CLINICAL DATA: Acute onset epigastric pain after eating this
evening. No vomiting or diarrhea.

EXAM:
CT ABDOMEN AND PELVIS WITH CONTRAST
TECHNIQUE: Multidetector CT imaging of the abdomen and pelvis was performed
using the standard protocol following bolus administration of
intravenous contrast.
CONTRAST:  100mL OMNIPAQUE IOHEXOL 300 MG/ML  SOLN

[Series 2: axial st · axial · 0.61mm/px · z∈[-630,-280]mm · 12 of 80 slices shown, 14 images]
[im 5/80  soft-tissue]
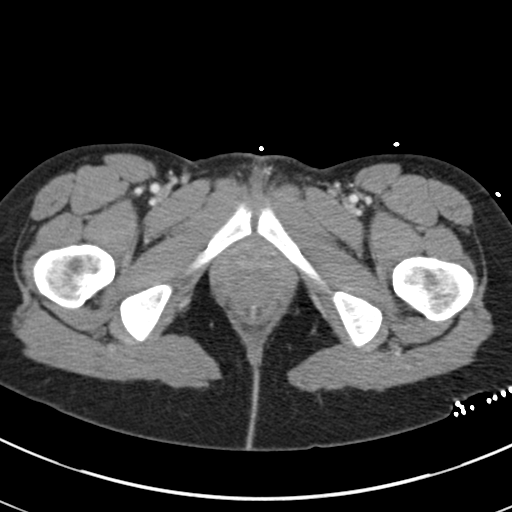
[im 5/80  bone]
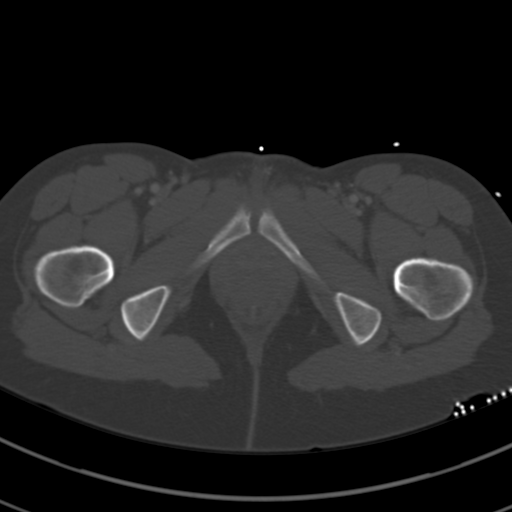
[im 10/80  soft-tissue]
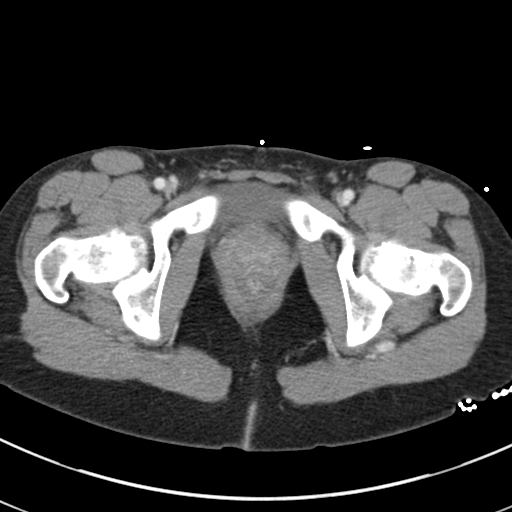
[im 20/80  soft-tissue]
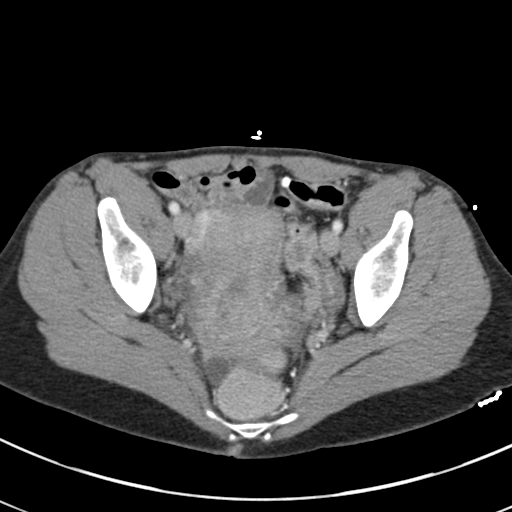
[im 25/80  soft-tissue]
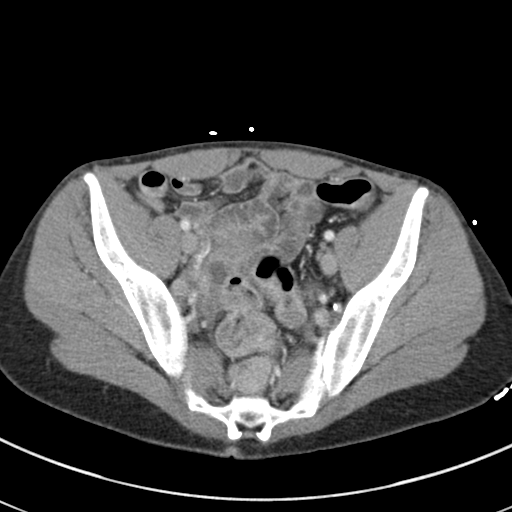
[im 30/80  soft-tissue]
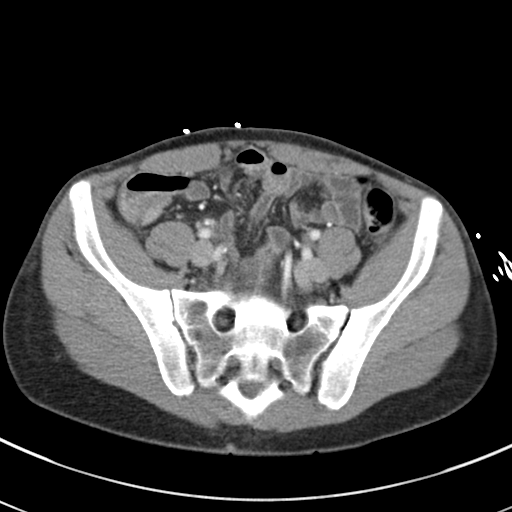
[im 35/80  soft-tissue]
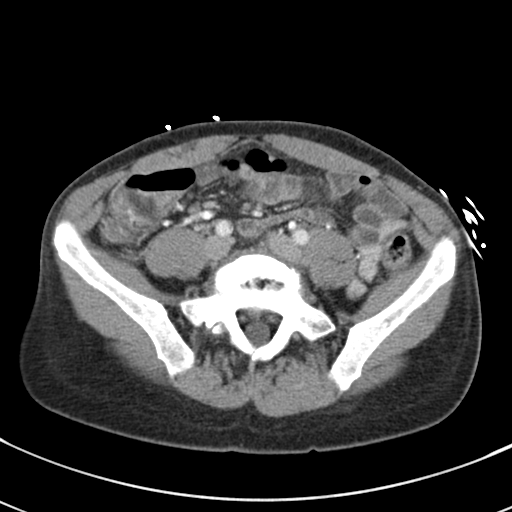
[im 45/80  soft-tissue]
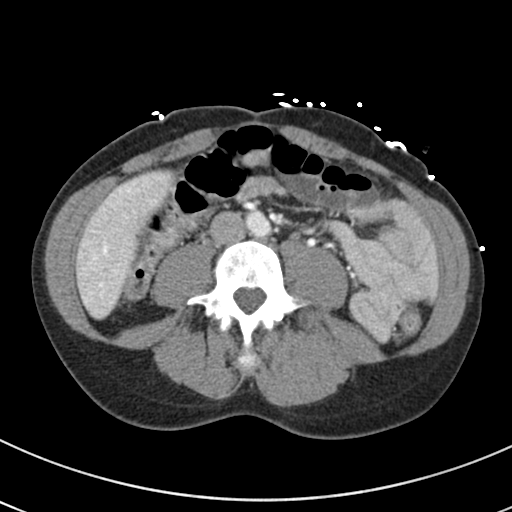
[im 50/80  soft-tissue]
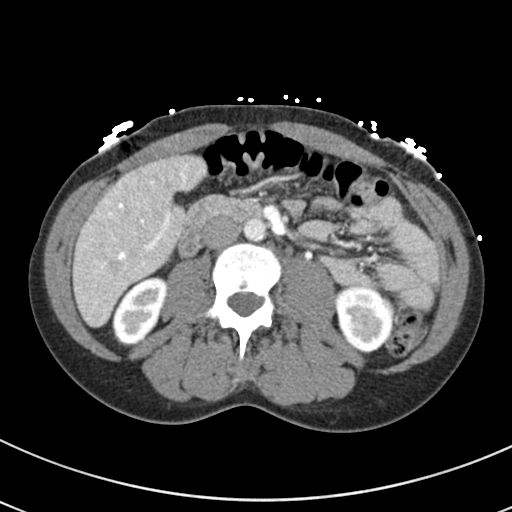
[im 55/80  soft-tissue]
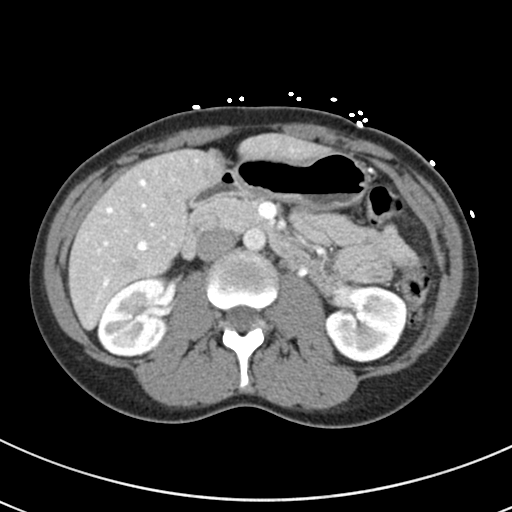
[im 55/80  bone]
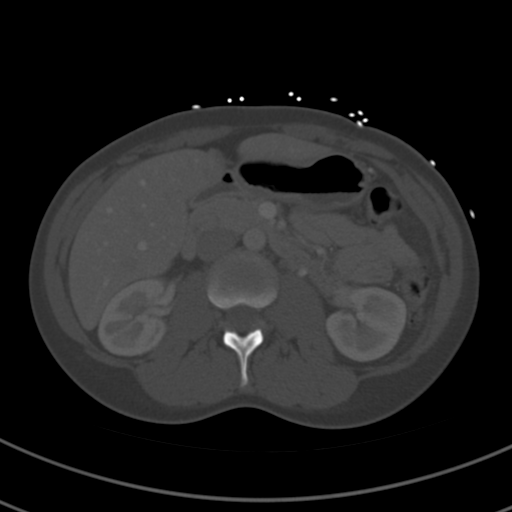
[im 60/80  soft-tissue]
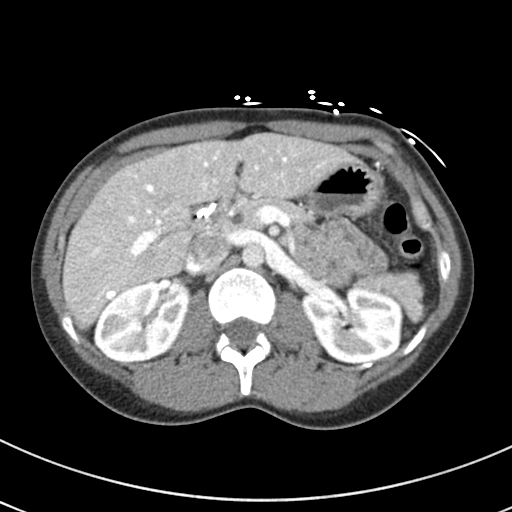
[im 70/80  soft-tissue]
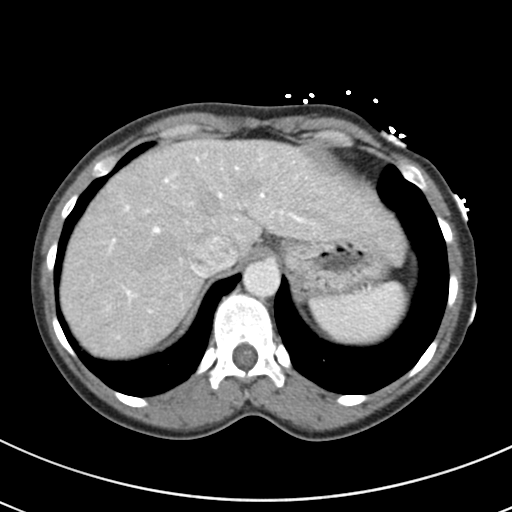
[im 75/80  soft-tissue]
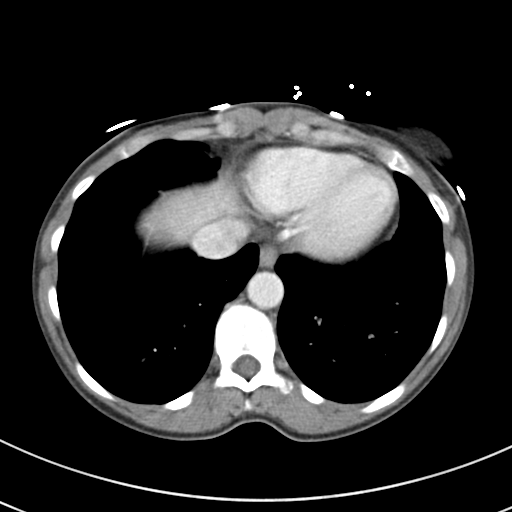

[Series 5: coronal st · coronal · 0.60mm/px · 3 of 105 slices shown]
[im 35/105  soft-tissue]
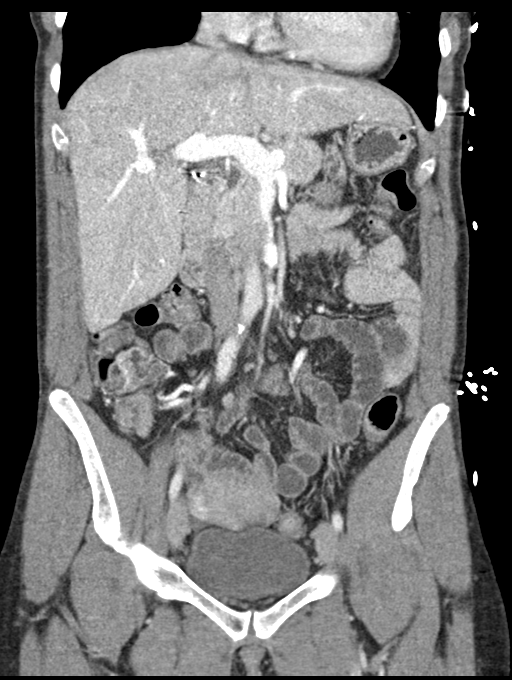
[im 47/105  soft-tissue]
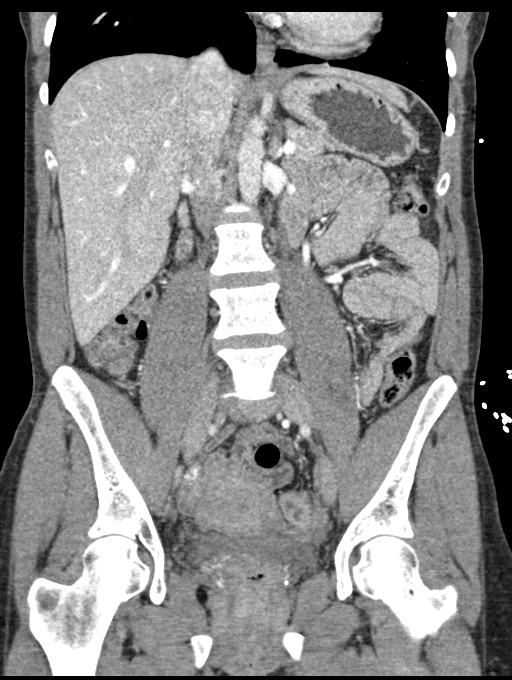
[im 58/105  soft-tissue]
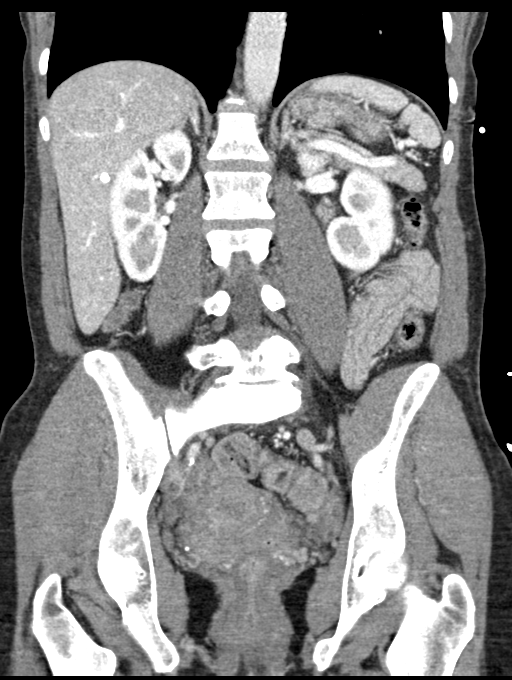

[15 of 46 positions shown; findings below may reference images not displayed]

FINDINGS: Lower chest: Clear lung bases. No significant pleural or pericardial
effusion.

Hepatobiliary: The liver is normal in density without suspicious
focal abnormality. Probable tiny cyst peripherally in the right lobe
best seen on coronal image 60/5. There are small subcapsular
calcifications posteriorly in the right lobe. No significant biliary
dilatation post cholecystectomy.

Pancreas: Unremarkable. No pancreatic ductal dilatation or
surrounding inflammatory changes.

Spleen: Normal in size without focal abnormality.

Adrenals/Urinary Tract: Both adrenal glands appear normal. Probable
tiny cyst in the interpolar region of the right kidney, best seen on
image [DATE]. The kidneys otherwise appear normal. No evidence of
urinary tract calculus or hydronephrosis. The bladder appears
normal.

Stomach/Bowel: No evidence of bowel wall thickening, distention or
surrounding inflammatory change. The appendix appears normal.

Vascular/Lymphatic: There are no enlarged abdominal or pelvic lymph
nodes. Mild aortic and branch vessel atherosclerosis. No acute
vascular findings. The portal, superior mesenteric and splenic veins
are patent.

Reproductive: Mild nonspecific myometrial heterogeneity with low
density near the cervix, probably nabothian cysts. Probable small
collapsing right ovarian follicle (image 60/2. No suspicious adnexal
findings.

Other: Trace pelvic ascites, within physiologic limits. No
generalized ascites, focal extraluminal fluid collection or free
air. Intact anterior abdominal wall.

Musculoskeletal: No acute or significant osseous findings.
Degenerative disc disease at L5-S1.
IMPRESSION: 1. No acute findings or explanation for the patient's symptoms.
2. Aortic Atherosclerosis ([id number]-BIT.T).
3. Probable incidental findings in the pelvis. Degenerative disc
disease at L5-S1.

## 2022-04-28 ENCOUNTER — Encounter (HOSPITAL_COMMUNITY): Payer: Self-pay

## 2022-04-28 ENCOUNTER — Emergency Department (HOSPITAL_COMMUNITY)
Admission: EM | Admit: 2022-04-28 | Discharge: 2022-04-28 | Disposition: A | Discharge status: Home / Self Care | Payer: BC Managed Care – PPO | Attending: Emergency Medicine | Admitting: Emergency Medicine

## 2022-04-28 ENCOUNTER — Emergency Department (HOSPITAL_COMMUNITY): Payer: BC Managed Care – PPO

## 2022-04-28 ENCOUNTER — Other Ambulatory Visit: Payer: Self-pay

## 2022-04-28 DIAGNOSIS — R102 Pelvic and perineal pain: Secondary | ICD-10-CM

## 2022-04-28 DIAGNOSIS — E876 Hypokalemia: Secondary | ICD-10-CM | POA: Insufficient documentation

## 2022-04-28 DIAGNOSIS — N946 Dysmenorrhea, unspecified: Secondary | ICD-10-CM | POA: Diagnosis not present

## 2022-04-28 LAB — COMPREHENSIVE METABOLIC PANEL
ALT: 13 U/L (ref 0–44)
AST: 19 U/L (ref 15–41)
Albumin: 4.2 g/dL (ref 3.5–5.0)
Alkaline Phosphatase: 48 U/L (ref 38–126)
Anion gap: 8 (ref 5–15)
BUN: 14 mg/dL (ref 6–20)
CO2: 22 mmol/L (ref 22–32)
Calcium: 9 mg/dL (ref 8.9–10.3)
Chloride: 108 mmol/L (ref 98–111)
Creatinine, Ser: 0.97 mg/dL (ref 0.44–1.00)
GFR, Estimated: >60 mL/min (ref >60)
Glucose, Bld: 97 mg/dL (ref 70–99)
Potassium: 3.3 mmol/L — ABNORMAL LOW (ref 3.5–5.1)
Sodium: 138 mmol/L (ref 135–145)
Total Bilirubin: 0.4 mg/dL (ref 0.3–1.2)
Total Protein: 7.7 g/dL (ref 6.5–8.1)

## 2022-04-28 LAB — I-STAT CHEM 8, ED
BUN: 13 mg/dL (ref 6–20)
Calcium, Ion: 1.1 mmol/L — ABNORMAL LOW (ref 1.15–1.40)
Chloride: 109 mmol/L (ref 98–111)
Creatinine, Ser: 0.9 mg/dL (ref 0.44–1.00)
Glucose, Bld: 91 mg/dL (ref 70–99)
HCT: 40 % (ref 36.0–46.0)
Hemoglobin: 13.6 g/dL (ref 12.0–15.0)
Potassium: 3.5 mmol/L (ref 3.5–5.1)
Sodium: 139 mmol/L (ref 135–145)
TCO2: 21 mmol/L — ABNORMAL LOW (ref 22–32)

## 2022-04-28 LAB — CBC
HCT: 37 % (ref 36.0–46.0)
Hemoglobin: 12.1 g/dL (ref 12.0–15.0)
MCH: 31.3 pg (ref 26.0–34.0)
MCHC: 32.7 g/dL (ref 30.0–36.0)
MCV: 95.6 fL (ref 80.0–100.0)
Platelets: 378 10*3/uL (ref 150–400)
RBC: 3.87 MIL/uL (ref 3.87–5.11)
RDW: 13.1 % (ref 11.5–15.5)
WBC: 7.6 10*3/uL (ref 4.0–10.5)
nRBC: 0 % (ref 0.0–0.2)

## 2022-04-28 LAB — I-STAT BETA HCG BLOOD, ED (MC, WL, AP ONLY): I-stat hCG, quantitative: <5 m[IU]/mL (ref <5)

## 2022-04-28 MED ORDER — ONDANSETRON HCL 4 MG/2ML IJ SOLN
4.0000 mg | Freq: Once | INTRAMUSCULAR | Status: AC
  Administered 2022-04-28: 4 mg via INTRAVENOUS
  Filled 2022-04-28: qty 2

## 2022-04-28 MED ORDER — KETOROLAC TROMETHAMINE 15 MG/ML IJ SOLN
15.0000 mg | Freq: Once | INTRAMUSCULAR | Status: AC
  Administered 2022-04-28: 15 mg via INTRAVENOUS
  Filled 2022-04-28: qty 1

## 2022-04-28 MED ORDER — FENTANYL CITRATE PF 50 MCG/ML IJ SOSY
50.0000 ug | PREFILLED_SYRINGE | Freq: Once | INTRAMUSCULAR | Status: AC
  Administered 2022-04-28: 50 ug via INTRAVENOUS
  Filled 2022-04-28: qty 1

## 2022-04-28 MED ORDER — LACTATED RINGERS IV BOLUS
500.0000 mL | Freq: Once | INTRAVENOUS | Status: AC
  Administered 2022-04-28: 500 mL via INTRAVENOUS

## 2022-04-28 NOTE — ED Notes (Signed)
[place] needs bladder full for scan

## 2022-04-28 NOTE — ED Provider Notes (Signed)
[facility] DEPT Provider Note   CSN: [id number] Arrival date & time: 04/28/22  4656     History {Add pertinent medical, surgical, social history, OB history to HPI:1} Chief Complaint  Patient presents with   Abdominal Pain    Turner Kunzman is a 42 y.o. female.  HPI 42 year old female G2 P2 normal menstrual cycles presents today complaining of pelvic cramping that began yesterday with onset of her menstrual cycle.  States her menstrual cycle came on a normal time that she is having heavier bleeding than usual.  She states that she is not pregnant.  Patient states pain is more on the right side wrapping around the right flank and comes to the suprapubic area.  She denies missing any menstrual cycles.  She is sexually active.  She has not had any abnormal vaginal discharge.  She states she had 1 abnormal Pap smear in the past approximately 15 years ago and has not had any since.  She does receive regular care and regular Pap smears.  Has never been told she had any abnormalities including no history of fibromas.    Home Medications Prior to Admission medications   Medication Sig Start Date End Date Taking? Authorizing Provider  HYDROcodone-acetaminophen (NORCO/VICODIN) 5-325 MG tablet Take 1 tablet by mouth every 4 (four) hours as needed for moderate pain or severe pain. 12/25/20   Charlann Lange, PA-C  ibuprofen (ADVIL) 600 MG tablet Take 1 tablet (600 mg total) by mouth every 6 (six) hours as needed. 12/25/20   Charlann Lange, PA-C  pantoprazole (PROTONIX) 20 MG tablet Take 1 tablet (20 mg total) by mouth daily. 10/05/19 11/04/19  Eustaquio Maize, PA-C      Allergies    Shellfish allergy and Other    Review of Systems   Review of Systems  Physical Exam Updated Vital Signs BP 122/87 (BP Location: Left Arm)   Pulse 64   Temp 97.9 F (36.6 C)   Resp 20   Ht 1.676 m ('5\' 6"'$ )   Wt 59 kg   SpO2 100%   BMI 20.99 kg/m  Physical Exam Vitals and nursing  note reviewed.  Constitutional:      General: She is in acute distress.     Appearance: She is well-developed. She is not ill-appearing.  HENT:     Head: Normocephalic.     Mouth/Throat:     Mouth: Mucous membranes are moist.  Eyes:     Extraocular Movements: Extraocular movements intact.  Cardiovascular:     Rate and Rhythm: Normal rate and regular rhythm.  Pulmonary:     Effort: Pulmonary effort is normal.     Breath sounds: Normal breath sounds.  Abdominal:     General: Abdomen is flat. Bowel sounds are normal.     Palpations: Abdomen is soft.     Tenderness: There is abdominal tenderness in the right lower quadrant, suprapubic area and left lower quadrant.  Neurological:     Mental Status: She is alert.     ED Results / Procedures / Treatments   Labs (all labs ordered are listed, but only abnormal results are displayed) Labs Reviewed  COMPREHENSIVE METABOLIC PANEL - Abnormal; Notable for the following components:      Result Value   Potassium 3.3 (*)    All other components within normal limits  I-STAT CHEM 8, ED - Abnormal; Notable for the following components:   Calcium, Ion 1.10 (*)    TCO2 21 (*)    All other  components within normal limits  CBC  I-STAT BETA HCG BLOOD, ED ([facility], [facility], [facility] ONLY)    EKG None  Radiology US PELVIC COMPLETE W TRANSVAGINAL AND TORSION R/O  Result Date: 04/28/2022 CLINICAL DATA:  Pelvic pain. EXAM: TRANSABDOMINAL AND TRANSVAGINAL ULTRASOUND OF PELVIS DOPPLER ULTRASOUND OF OVARIES TECHNIQUE: Both transabdominal and transvaginal ultrasound examinations of the pelvis were performed. Transabdominal technique was performed for global imaging of the pelvis including uterus, ovaries, adnexal regions, and pelvic cul-de-sac. It was necessary to proceed with endovaginal exam following the transabdominal exam to visualize the uterus, endometrium ovaries, and adnexal regions. Color and duplex Doppler ultrasound was utilized to evaluate blood flow to the  ovaries. COMPARISON:  CT of the abdomen and pelvis on 10/05/2019 FINDINGS: Uterus Measurements: 9.5 x 4.6 x 5.6 centimeters = volume: 126.6 mL. At least 5 small hypoechoic masses are identified within the uterus, largest measuring 1.7 x 1.5 x 1.8 centimeters in the fundal region. Endometrium Thickness: 10.9 millimeters.  No focal abnormality visualized. Right ovary Measurements: 4.3 x 1.3 x 2.3 centimeters = volume: 6.7 mL. Normal appearance/no adnexal mass. Left ovary Measurements: 4.1 x 1.7 x 2.1 centimeters = volume: 7.4 mL. Normal appearance/no adnexal mass. Pulsed Doppler evaluation of both ovaries demonstrates normal low-resistance arterial and venous waveforms. Other findings A small amount of fluid is seen within the cul-de-sac. IMPRESSION: Multiple small uterine fibroids, largest 1.8 centimeters. Normal appearance of both ovaries. No evidence for ovarian torsion or adnexal mass. Electronically Signed   By: Nolon Nations M.D.   On: 04/28/2022 13:35   CT Renal Stone Study  Result Date: 04/28/2022 CLINICAL DATA:  Right flank pain EXAM: CT ABDOMEN AND PELVIS WITHOUT CONTRAST TECHNIQUE: Multidetector CT imaging of the abdomen and pelvis was performed following the standard protocol without IV contrast. RADIATION DOSE REDUCTION: This exam was performed according to the departmental dose-optimization program which includes automated exposure control, adjustment of the mA and/or kV according to patient size and/or use of iterative reconstruction technique. COMPARISON:  10/05/2019 FINDINGS: Lower chest: No acute abnormality. Hepatobiliary: No focal liver abnormality is seen. Status post cholecystectomy. No biliary dilatation. Pancreas: Unremarkable. No pancreatic ductal dilatation or surrounding inflammatory changes. Spleen: Normal in size without focal abnormality. Adrenals/Urinary Tract: Adrenal glands are unremarkable. Kidneys are normal, without renal calculi, focal lesion, or hydronephrosis. Bladder is  unremarkable. Stomach/Bowel: Stomach is within normal limits. Appendix appears normal. No evidence of bowel wall thickening, distention, or inflammatory changes. Moderate volume stool throughout the colon. Vascular/Lymphatic: Scattered aortoiliac atherosclerotic calcifications without aneurysm. No abdominopelvic lymphadenopathy. Reproductive: Anteverted uterus. Rounded low-density region at the level of the cervix, likely nabothian cyst, unchanged from prior. No adnexal masses. Other: Small volume free fluid within the cul-de-sac. No organized abdominopelvic fluid collection. No pneumoperitoneum. No abdominal wall hernia. Musculoskeletal: No acute or significant osseous findings. Degenerative disc disease of L5-S1. IMPRESSION: 1. No acute abdominopelvic findings. Specifically, no evidence of obstructive uropathy. 2. Moderate volume stool throughout the colon. Correlate for constipation. 3. Small volume free fluid within the cul-de-sac, likely physiologic. 4. Aortic atherosclerosis (ICD10-I70.0). Electronically Signed   By: Davina Poke D.O.   On: 04/28/2022 11:55    Procedures Procedures  {Document cardiac monitor, telemetry assessment procedure when appropriate:1}  Medications Ordered in ED Medications  ketorolac (TORADOL) 15 MG/ML injection 15 mg (15 mg Intravenous Given 04/28/22 1044)  ondansetron (ZOFRAN) injection 4 mg (4 mg Intravenous Given 04/28/22 1044)  lactated ringers bolus 500 mL (0 mLs Intravenous Stopped 04/28/22 1232)  fentaNYL (SUBLIMAZE) injection 50  mcg (50 mcg Intravenous Given 04/28/22 1044)  fentaNYL (SUBLIMAZE) injection 50 mcg (50 mcg Intravenous Given 04/28/22 1408)    ED Course/ Medical Decision Making/ A&P Clinical Course as of 04/28/22 1524  Mon Apr 28, 2022  1509 Pelvic ultrasound reviewed interpreted significant for multiple small uterine fibroids with normal appearance of both ovaries with no evidence for torsion or adnexal mass [DR]  1515 CT abdomen pelvis reviewed  interpreted and no acute abdominopelvic findings There is moderate volume stool There is small volume of free fluid likely physiologic  [DR]    Clinical Course User Index [DR] Pattricia Boss, MD                           Medical Decision Making Amount and/or Complexity of Data Reviewed Labs: ordered. Radiology: ordered.  Risk Prescription drug management.  42 year old female presents today complaining of crampy right-sided abdominal pain that began today with her menstrual cycle.  She does not usually have significant pain with her menstrual cycle.  She describes the onset as crampy and severe.  She has had increased bleeding from baseline but.  Is occurring at normal time.  She has not had any abnormal discharge Differential diagnosis includes but is not limited to Ectopic pregnancy scan Rensi test is negative Dysmennorhea Ureteral colic no evidence of stone on Appendicitis-CT without acute abnormality noted PID and pelvic infections-doubt as patient denies any abnormal vaginal discharge  Patient was evaluated here in the ED with CBC, pregnancy test, [facility] metabolic panel was significant only for some mild hypokalemia She was evaluated with a CT and ultrasound of her abdomen and pelvis ET was reported the abdomen did not show thing acute abnormality ultrasounds performed the pelvis including torsion.  For some small fibroids.  No etiology of pain Patient was treated here with Toradol and fentanyl.  She feels somewhat improved.  She states that she will use her Midol at home. We have discussed return precautions and need for follow-up and she voices understanding  {Document critical care time when appropriate:1} {Document review of labs and clinical decision tools ie heart score, Chads2Vasc2 etc:1}  {Document your independent review of radiology images, and any outside records:1} {Document your discussion with family members, caretakers, and with consultants:1} {Document social  determinants of health affecting pt's care:1} {Document your decision making why or why not admission, treatments were needed:1} Final Clinical Impression(s) / ED Diagnoses Final diagnoses:  Pelvic pain  Dysmenorrhea    Rx / DC Orders ED Discharge Orders     None

## 2022-04-28 NOTE — ED Notes (Signed)
Pt feeling better. PAin decreased. Nausea resolved. Attempting to hold bladder for [place]. HAs to void. Up to b/r. [place] tech notified. PT alert, NAD, calm, interactive, steady gait.

## 2022-04-28 NOTE — ED Notes (Signed)
Transvaginal US complete. Verbalizes pain increased.

## 2022-04-28 NOTE — ED Notes (Signed)
US at BS

## 2022-04-28 NOTE — ED Triage Notes (Signed)
BIBA from work for lower abd cramping and numbness/tingling to hands and feet.  Pt reports started menstrual last night with heavy bleeding. intermittent nausea

## 2022-04-28 NOTE — Discharge Instructions (Addendum)
You were evaluated here in the emergency department for pelvic pain with labs, CT scan, and ultrasound. No definite cause of your pelvic pain was found Please recheck with your doctor as discussed Please return if you are having worsening symptoms especially increased pain, inability to tolerate fluids or other new symptoms.

## 2022-06-10 ENCOUNTER — Other Ambulatory Visit: Payer: Self-pay | Admitting: Obstetrics and Gynecology

## 2022-06-10 DIAGNOSIS — D259 Leiomyoma of uterus, unspecified: Secondary | ICD-10-CM
